# Patient Record
Sex: Male | Born: 1970 | Race: White | Hispanic: No | Marital: Single | Smoking: Current every day smoker
Health system: Southern US, Community
[De-identification: ages and names within clinical notes are randomized; demographics above are authoritative.]

## PROBLEM LIST (undated history)

## (undated) DIAGNOSIS — F329 Major depressive disorder, single episode, unspecified: Secondary | ICD-10-CM

## (undated) DIAGNOSIS — F32A Depression, unspecified: Secondary | ICD-10-CM

## (undated) DIAGNOSIS — F419 Anxiety disorder, unspecified: Secondary | ICD-10-CM

---

## 1998-09-28 ENCOUNTER — Emergency Department (HOSPITAL_COMMUNITY): Admission: EM | Admit: 1998-09-28 | Discharge: 1998-09-28 | Payer: Self-pay | Admitting: Emergency Medicine

## 2005-06-27 ENCOUNTER — Emergency Department (HOSPITAL_COMMUNITY): Admission: EM | Admit: 2005-06-27 | Discharge: 2005-06-27 | Payer: Self-pay | Admitting: Emergency Medicine

## 2009-03-09 ENCOUNTER — Emergency Department (HOSPITAL_COMMUNITY): Admission: EM | Admit: 2009-03-09 | Discharge: 2009-03-09 | Payer: Self-pay | Admitting: Family Medicine

## 2009-08-24 ENCOUNTER — Emergency Department (HOSPITAL_COMMUNITY): Admission: EM | Admit: 2009-08-24 | Discharge: 2009-08-24 | Payer: Self-pay | Admitting: Family Medicine

## 2011-08-30 ENCOUNTER — Emergency Department (HOSPITAL_COMMUNITY)
Admission: EM | Admit: 2011-08-30 | Discharge: 2011-08-30 | Disposition: A | Discharge status: Home / Self Care | Payer: Self-pay | Attending: Emergency Medicine | Admitting: Emergency Medicine

## 2011-08-30 ENCOUNTER — Encounter (HOSPITAL_COMMUNITY): Payer: Self-pay | Admitting: *Deleted

## 2011-08-30 DIAGNOSIS — S60229A Contusion of unspecified hand, initial encounter: Secondary | ICD-10-CM | POA: Insufficient documentation

## 2011-08-30 DIAGNOSIS — IMO0002 Reserved for concepts with insufficient information to code with codable children: Secondary | ICD-10-CM | POA: Insufficient documentation

## 2011-08-30 DIAGNOSIS — G8929 Other chronic pain: Secondary | ICD-10-CM | POA: Insufficient documentation

## 2011-08-30 DIAGNOSIS — S76219A Strain of adductor muscle, fascia and tendon of unspecified thigh, initial encounter: Secondary | ICD-10-CM

## 2011-08-30 DIAGNOSIS — M549 Dorsalgia, unspecified: Secondary | ICD-10-CM | POA: Insufficient documentation

## 2011-08-30 HISTORY — DX: Depression, unspecified: F32.A

## 2011-08-30 HISTORY — DX: Major depressive disorder, single episode, unspecified: F32.9

## 2011-08-30 HISTORY — DX: Anxiety disorder, unspecified: F41.9

## 2011-08-30 LAB — URINALYSIS, ROUTINE W REFLEX MICROSCOPIC
Glucose, UA: NEGATIVE mg/dL
Hgb urine dipstick: NEGATIVE
Nitrite: NEGATIVE
Urobilinogen, UA: 0.2 mg/dL (ref 0.0–1.0)

## 2011-08-30 NOTE — ED Notes (Signed)
Left groin pain first noticed last Tuesday. Pain is worse when driving and bending down. Pt also states sensation to urinate when he doesn't have to. Also hit left hand with hammer last Wednesday. NAD.

## 2011-08-30 NOTE — ED Provider Notes (Signed)
History    This chart was scribed for Charles B. Bernette Mayers, MD, MD by Smitty Pluck. The patient was seen in room APA15 and the patient's care was started at 11:01AM.   CSN: [id number]  Arrival date & time 08/30/11  1038   First MD Initiated Contact with Patient 08/30/11 1052      No chief complaint on file.   (Consider location/radiation/quality/duration/timing/severity/associated sxs/prior treatment) The history is provided by the patient.   Marc Williams is a 41 y.o. male who presents to the Emergency Department complaining of constant, moderate groin pain onset 1 week ago and left hand pain due to hitting his hand with hammer. Pt reports that pain is worse on left side compared to right. Pt denies dysuria, fever and emesis. Pt has chronic back pain. Pt reports that when he bends down the groin pain is aggravated. Groin pain began at work while bending over and moving objects.  PCP is Dr. Collene Schlichter  No past medical history on file.  No past surgical history on file.  No family history on file.  History  Substance Use Topics  . Smoking status: Not on file  . Smokeless tobacco: Not on file  . Alcohol Use: Not on file      Review of Systems  All other systems reviewed and are negative.   10 Systems reviewed and all are negative for acute change except as noted in the HPI.   Allergies  Review of patient's allergies indicates not on file.  Home Medications  No current outpatient prescriptions on file.  BP 126/82  Pulse 75  Temp 98.4 F (36.9 C) (Oral)  Resp 16  Ht 5\' 10"  (1.778 m)  Wt 145 lb (65.772 kg)  BMI 20.81 kg/m2  SpO2 98%  Physical Exam  Nursing note and vitals reviewed. Constitutional: He is oriented to person, place, and time. He appears well-developed and well-nourished.  HENT:  Head: Normocephalic and atraumatic.  Eyes: EOM are normal. Pupils are equal, round, and reactive to light.  Neck: Normal range of motion. Neck supple.  Cardiovascular:  Normal rate, normal heart sounds and intact distal pulses.   Pulmonary/Chest: Effort normal and breath sounds normal.  Abdominal: Bowel sounds are normal. He exhibits no distension. There is no tenderness.  Genitourinary:       No swelling  No rash No inguinal hernia  No inguinal adenopathy  Testicles nl   Musculoskeletal: Normal range of motion. He exhibits tenderness (mild tenderness to L hand). He exhibits no edema.  Neurological: He is alert and oriented to person, place, and time. He has normal strength. No cranial nerve deficit or sensory deficit.  Skin: Skin is warm and dry. No rash noted.  Psychiatric: He has a normal mood and affect.    ED Course  Procedures (including critical care time) DIAGNOSTIC STUDIES: Oxygen Saturation is 98% on room air, normal by my interpretation.    COORDINATION OF CARE: 11:09AM EDP discusses pt ED treatment with pt       Labs Reviewed - No data to display No results found.   No diagnosis found.    MDM  UA neg, patient is tender in the groin ligaments, no concern for torsion. Hand contusion is healing, no need for imaging. Advised rest, NSAIDs. PCP followup.  I personally performed the services described in the documentation, which were scribed in my presence. The recorded information has been reviewed and considered.         Charles B. Bernette Mayers, MD 08/30/11  1212 

## 2012-09-11 ENCOUNTER — Ambulatory Visit: Payer: Self-pay | Admitting: Family Medicine

## 2014-11-05 ENCOUNTER — Ambulatory Visit (INDEPENDENT_AMBULATORY_CARE_PROVIDER_SITE_OTHER): Payer: BLUE CROSS/BLUE SHIELD | Admitting: *Deleted

## 2014-11-05 DIAGNOSIS — Z23 Encounter for immunization: Secondary | ICD-10-CM | POA: Diagnosis not present

## 2015-08-12 ENCOUNTER — Ambulatory Visit (INDEPENDENT_AMBULATORY_CARE_PROVIDER_SITE_OTHER): Payer: Worker's Compensation

## 2015-08-12 ENCOUNTER — Encounter: Payer: Self-pay | Admitting: Family Medicine

## 2015-08-12 ENCOUNTER — Ambulatory Visit (INDEPENDENT_AMBULATORY_CARE_PROVIDER_SITE_OTHER): Payer: Worker's Compensation | Admitting: Family Medicine

## 2015-08-12 VITALS — BP 118/79 | HR 68 | Temp 97.5°F | Ht 70.0 in | Wt 193.0 lb

## 2015-08-12 DIAGNOSIS — R0789 Other chest pain: Secondary | ICD-10-CM

## 2015-08-12 DIAGNOSIS — S20211A Contusion of right front wall of thorax, initial encounter: Secondary | ICD-10-CM | POA: Diagnosis not present

## 2015-08-12 MED ORDER — DICLOFENAC SODIUM 75 MG PO TBEC: 75.0000 mg | DELAYED_RELEASE_TABLET | Freq: Two times a day (BID) | ORAL | Status: AC

## 2015-08-12 NOTE — Progress Notes (Signed)
   Subjective:  Patient ID: Marc Williams, male    DOB: 08/19/1970  Age: 45 y.o. MRN: [id number]  CC: workers comp   HPI Marc Williams presents for Patient had a fall on June 23. He was prying some material and the material moved releasing the private bar causing him to fall sideways and hit a guardrail with his right side chest wall. Since that time he has had significant pain locally and it has been hard to take deep breaths. He hears some rattling in his chest. He denies dyspnea. His pain is rather severe intermittently when he coughs or laughs or bends to either side, etc.  ROS Review of Systems  Constitutional: Negative for fever, chills and diaphoresis.  HENT: Negative for rhinorrhea and sore throat.   Respiratory: Negative for cough and shortness of breath.   Cardiovascular: Negative for chest pain.  Gastrointestinal: Negative for abdominal pain.  Musculoskeletal: Negative for myalgias and arthralgias.  Skin: Negative for rash.  Neurological: Negative for weakness and headaches.    Objective:  BP 118/79 mmHg  Pulse 68  Temp(Src) 97.5 F (36.4 C) (Oral)  Ht 5\' 10"  (1.778 m)  Wt 193 lb (87.544 kg)  BMI 27.69 kg/m2  Physical Exam  Constitutional: He is oriented to person, place, and time. He appears well-developed and well-nourished.  HENT:  Head: Normocephalic and atraumatic.  Right Ear: External ear normal.  Left Ear: External ear normal.  Mouth/Throat: No oropharyngeal exudate or posterior oropharyngeal erythema.  Eyes: Pupils are equal, round, and reactive to light.  Neck: Normal range of motion. Neck supple.  Cardiovascular: Normal rate and regular rhythm.   No murmur heard. Pulmonary/Chest: Breath sounds normal. No respiratory distress.  Musculoskeletal: He exhibits tenderness (right anterior axillary line at ribs8-9).  Neurological: He is alert and oriented to person, place, and time.  Vitals reviewed.   Assessment & Plan:   Marc Williams was seen today  for workers comp.  Diagnoses and all orders for this visit:  Rib contusion, right, initial encounter -     DG Ribs Unilateral W/Chest Right; Future  Chest wall pain -     DG Ribs Unilateral W/Chest Right; Future  Other orders -     diclofenac (VOLTAREN) 75 MG EC tablet; Take 1 tablet (75 mg total) by mouth 2 (two) times daily. For muscle and  Joint pain   XR- no fx noted Lungs show no acute changes.    Follow-up: Return in about 2 weeks (around 08/26/2015).  Mechele ClaudeWarren Kalon Erhardt, M.D.

## 2015-08-26 ENCOUNTER — Other Ambulatory Visit: Payer: Self-pay | Admitting: Family Medicine

## 2015-09-09 ENCOUNTER — Ambulatory Visit (INDEPENDENT_AMBULATORY_CARE_PROVIDER_SITE_OTHER): Payer: Worker's Compensation | Admitting: Family Medicine

## 2015-09-09 ENCOUNTER — Ambulatory Visit (INDEPENDENT_AMBULATORY_CARE_PROVIDER_SITE_OTHER): Payer: Worker's Compensation

## 2015-09-09 ENCOUNTER — Encounter: Payer: Self-pay | Admitting: Family Medicine

## 2015-09-09 VITALS — BP 107/67 | HR 61 | Temp 97.1°F | Ht 70.0 in | Wt 194.2 lb

## 2015-09-09 DIAGNOSIS — S20211A Contusion of right front wall of thorax, initial encounter: Secondary | ICD-10-CM

## 2015-09-09 DIAGNOSIS — R0789 Other chest pain: Secondary | ICD-10-CM | POA: Diagnosis not present

## 2015-09-09 DIAGNOSIS — S20211D Contusion of right front wall of thorax, subsequent encounter: Secondary | ICD-10-CM | POA: Diagnosis not present

## 2015-09-09 MED ORDER — PREDNISONE 10 MG PO TABS: ORAL_TABLET | ORAL | 0 refills | Status: DC

## 2015-09-09 NOTE — Progress Notes (Signed)
   Subjective:  Patient ID: Marc Williams, male    DOB: 12/28/1970  Age: 45 y.o. MRN: [id number]  CC: WC rck (rib contusion)   HPI Marc Williams presents for still has pain. Worse with shoveling, heavy lifting. Wakes up daily with soreness. Concerned about swelling. Denies dyspnea. Pain is somewhat better. Still moderately severe after a 12 hour shift especially.The patient is very concerned about doing the right thing. He says he is concerned that the company doesn't have any type of light duty for him. He feels he needs to continue his full duty because of his responsibility to his job. However he realizes that his progress has been less than anticipated.  ROS Review of Systems  Constitutional: Negative for chills, fatigue and fever.  HENT: Negative for congestion.   Respiratory: Negative for cough, shortness of breath and wheezing.   Musculoskeletal: Positive for joint swelling and myalgias.  Skin: Negative for color change and rash.  Neurological: Negative for dizziness and syncope.    Objective:  BP 107/67 (BP Location: Left Arm, Patient Position: Sitting, Cuff Size: Normal)   Pulse 61   Temp 97.1 F (36.2 C) (Oral)   Ht 5\' 10"  (1.778 m)   Wt 194 lb 3.2 oz (88.1 kg)   SpO2 97%   BMI 27.86 kg/m   Physical Exam  Constitutional: He is oriented to person, place, and time. He appears well-developed and well-nourished.  HENT:  Head: Normocephalic and atraumatic.  Mouth/Throat: No oropharyngeal exudate or posterior oropharyngeal erythema.  Eyes: Pupils are equal, round, and reactive to light.  Neck: Normal range of motion. Neck supple.  Cardiovascular: Normal rate and regular rhythm.   No murmur heard. Pulmonary/Chest: Breath sounds normal. No respiratory distress.  Musculoskeletal: He exhibits edema (moderate at right lower anterior chest) and tenderness (right lower chest. Inferior to nipple.for moderate palpation & percussion).  Neurological: He is alert and oriented  to person, place, and time.  Vitals reviewed.   Assessment & Plan:   Marc Williams was seen today for wc rck.  Diagnoses and all orders for this visit:  Rib contusion, right, initial encounter -     predniSONE (DELTASONE) 10 MG tablet; Take 5 daily for 3 days followed by 4,3,2 and 1 for 3 days each. -     DG Ribs Unilateral W/Chest Right; Future  Chest wall pain -     DG Ribs Unilateral W/Chest Right; Future   Based on continued visible palpable edema think it is clear that the patient needs to be on a light duty program. He'll be limited to lifting with his torso of 5 pounds routinely 10 pounds occasionally. This includes twisting activities due to the involvement of the upper abdominal oblique muscle group.    Follow-up: Return in about 1 month (around 10/10/2015).  Mechele ClaudeWarren Chase Knebel, M.D.

## 2015-10-09 ENCOUNTER — Ambulatory Visit (INDEPENDENT_AMBULATORY_CARE_PROVIDER_SITE_OTHER): Payer: Worker's Compensation | Admitting: Family Medicine

## 2015-10-09 ENCOUNTER — Encounter: Payer: Self-pay | Admitting: Family Medicine

## 2015-10-09 VITALS — BP 110/75 | HR 78 | Temp 97.4°F | Ht 70.0 in | Wt 190.5 lb

## 2015-10-09 DIAGNOSIS — S2231XG Fracture of one rib, right side, subsequent encounter for fracture with delayed healing: Secondary | ICD-10-CM | POA: Diagnosis not present

## 2015-10-09 DIAGNOSIS — R0789 Other chest pain: Secondary | ICD-10-CM

## 2015-10-09 DIAGNOSIS — S20211D Contusion of right front wall of thorax, subsequent encounter: Secondary | ICD-10-CM | POA: Diagnosis not present

## 2015-10-09 NOTE — Progress Notes (Signed)
   Subjective:  Patient ID: Marc Williams, male    DOB: 07/08/1970  Age: 45 y.o. MRN: [id number]  CC: Workers Comp follow up (pt here today for 1 month follow up after falling into the corner of a safety rail at work, pt states it feels worse.)   HPI Marc Williams presents for Recheck of injury to the right seventh rib. The incident happened on June 23. The patient fell against a railing and fractured his seventh right anterior rib. Initially the x-ray was read as negative for fracture. Follow-up x-ray showed evidence for healing, but nonacute. Unfortunately healing Has been very slow. He states that the pain now is worse than it was last time he was here month ago. He states that at work he was sent out to do his usual job. 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 Unfortunately if the [employer] does not place those properly in the oven, they will fall backwards he has to lift each one individually into the oven to melt them. This leads to bending lifting 120 pound weight carrying it and twisting with it. Restrictions had been given and apparently he was unable to perform the work required even though those requirements exceeded his restrictions by his account. Therefore he was terminated from his job.   ROS Review of Systems  Constitutional: Activity change: ;Unable to perform the lifting required at work.  HENT: Negative.   Eyes: Negative.   Respiratory: Negative.   Cardiovascular: Negative.   Musculoskeletal: Positive for arthralgias and myalgias.    Objective:  BP 110/75   Pulse 78   Temp 97.4 F (36.3 C) (Oral)   Ht 5\' 10"  (1.778 m)   Wt 190 lb 8 oz (86.4 kg)   BMI 27.33 kg/m   Physical Exam  Constitutional: He appears well-developed and well-nourished. No distress.  HENT:  Head: Normocephalic.  Neck: Normal range of motion.  Cardiovascular: Normal rate and regular rhythm.   Pulmonary/Chest: Breath sounds normal. No respiratory distress.    Musculoskeletal: He exhibits tenderness (Right lower anterior chest at the level of thePreviously describedTrauma).    Assessment & Plan:     Follow-up: As needed after released by orthopedics. Continue diclofenac for now. He should continue work restrictions for lifting and bending twisting.  Mechele ClaudeWarren Makala Fetterolf, M.D.

## 2016-04-20 ENCOUNTER — Ambulatory Visit: Payer: Self-pay | Admitting: Family Medicine

## 2016-04-25 ENCOUNTER — Encounter: Payer: Self-pay | Admitting: Family Medicine

## 2016-11-28 ENCOUNTER — Other Ambulatory Visit (HOSPITAL_COMMUNITY): Payer: Self-pay | Admitting: Sports Medicine

## 2016-11-28 DIAGNOSIS — R0789 Other chest pain: Secondary | ICD-10-CM

## 2016-12-06 ENCOUNTER — Encounter (HOSPITAL_COMMUNITY)
Admission: RE | Admit: 2016-12-06 | Discharge: 2016-12-06 | Disposition: A | Discharge status: Home / Self Care | Payer: Worker's Compensation | Source: Ambulatory Visit | Attending: Sports Medicine | Admitting: Sports Medicine

## 2016-12-06 ENCOUNTER — Ambulatory Visit (HOSPITAL_COMMUNITY)
Admission: RE | Admit: 2016-12-06 | Discharge: 2016-12-06 | Disposition: A | Discharge status: Home / Self Care | Payer: Worker's Compensation | Source: Ambulatory Visit | Attending: Sports Medicine | Admitting: Sports Medicine

## 2016-12-06 DIAGNOSIS — R0789 Other chest pain: Secondary | ICD-10-CM | POA: Insufficient documentation

## 2016-12-06 MED ORDER — TECHNETIUM TC 99M MEDRONATE IV KIT
20.0000 | PACK | Freq: Once | INTRAVENOUS | Status: AC | PRN
  Administered 2016-12-06: 20 via INTRAVENOUS

## 2018-07-29 IMAGING — NM NM BONE WHOLE BODY
2 series · 2 of 2 positions shown · non-contrast
Comparison: Chest x-ray from September 09, 2015

CLINICAL DATA: Anterior chest wall pain. Right anterior seventh rib
fracture.

EXAM:
NUCLEAR MEDICINE WHOLE BODY BONE SCAN
TECHNIQUE: Whole body anterior and posterior images were obtained approximately
3 hours after intravenous injection of radiopharmaceutical.
RADIOPHARMACEUTICALS:  20 mCi 6echnetium-PPm MDP IV

[Series 1: whole body · 2.66mm/px · 1 of 1 slices shown (1 of 2)]
[im 1/1]
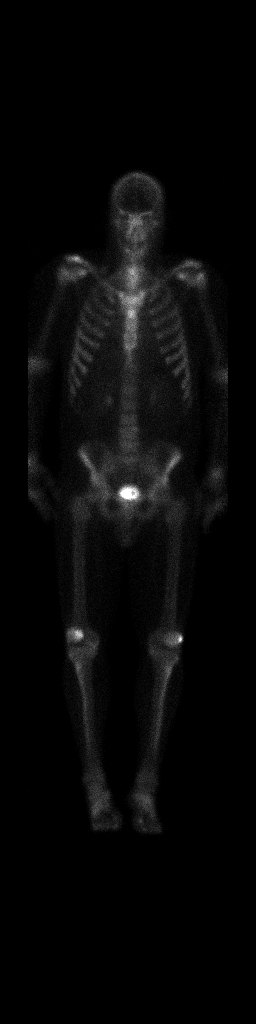

[Series 1: whole body · 2.66mm/px · 1 of 1 slices shown (2 of 2)]
[im 1/1]
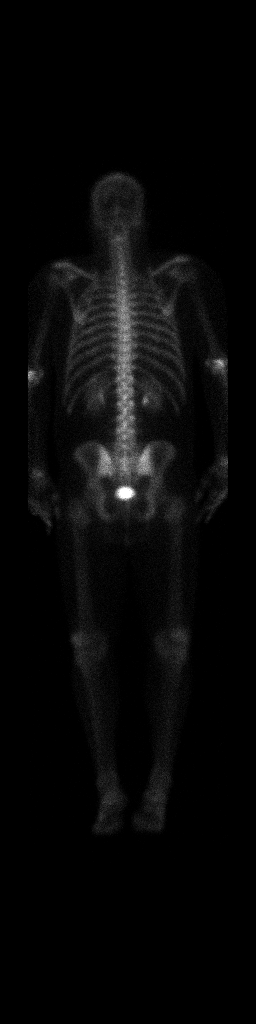

[2 of 2 positions shown; findings below may reference images not displayed]

FINDINGS: There is focal increased radiotracer uptake in the upper sternum. No
other focal increased radiotracer uptake is identified.
IMPRESSION: Focal increased radiotracer uptake in the upper sternum. Correlation
with sternal x-ray is recommended.
